# Patient Record
Sex: Female | Born: 1995 | Race: Black or African American | Hispanic: No | Marital: Single | Smoking: Never smoker
Health system: Southern US, Community
[De-identification: ages and names within clinical notes are randomized; demographics above are authoritative.]

## PROBLEM LIST (undated history)

## (undated) DIAGNOSIS — D649 Anemia, unspecified: Secondary | ICD-10-CM

## (undated) HISTORY — PX: APPENDECTOMY: SHX54

---

## 2017-03-20 ENCOUNTER — Ambulatory Visit (HOSPITAL_COMMUNITY)
Admission: EM | Admit: 2017-03-20 | Discharge: 2017-03-20 | Disposition: A | Discharge status: Home / Self Care | Payer: Managed Care, Other (non HMO) | Attending: Family Medicine | Admitting: Family Medicine

## 2017-03-20 ENCOUNTER — Encounter (HOSPITAL_COMMUNITY): Payer: Self-pay | Admitting: Emergency Medicine

## 2017-03-20 DIAGNOSIS — R1110 Vomiting, unspecified: Secondary | ICD-10-CM
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
## 2017-04-08 ENCOUNTER — Other Ambulatory Visit: Payer: Self-pay | Admitting: Nurse Practitioner

## 2017-04-08 DIAGNOSIS — R109 Unspecified abdominal pain: Secondary | ICD-10-CM

## 2017-04-08 DIAGNOSIS — R102 Pelvic and perineal pain: Secondary | ICD-10-CM

## 2017-04-29 ENCOUNTER — Other Ambulatory Visit: Payer: Managed Care, Other (non HMO)

## 2017-06-08 ENCOUNTER — Emergency Department (HOSPITAL_COMMUNITY)
Admission: EM | Admit: 2017-06-08 | Discharge: 2017-06-08 | Disposition: A | Discharge status: Home / Self Care | Payer: Managed Care, Other (non HMO) | Attending: Emergency Medicine | Admitting: Emergency Medicine

## 2017-06-08 ENCOUNTER — Encounter (HOSPITAL_COMMUNITY): Payer: Self-pay | Admitting: Emergency Medicine

## 2017-06-08 ENCOUNTER — Emergency Department (HOSPITAL_COMMUNITY): Payer: Managed Care, Other (non HMO)

## 2017-06-08 DIAGNOSIS — R0789 Other chest pain: Secondary | ICD-10-CM | POA: Diagnosis not present

## 2017-06-08 DIAGNOSIS — R195 Other fecal abnormalities: Secondary | ICD-10-CM | POA: Insufficient documentation

## 2017-06-08 DIAGNOSIS — R1030 Lower abdominal pain, unspecified: Secondary | ICD-10-CM | POA: Insufficient documentation

## 2017-06-08 LAB — CBC
HCT: 36.4 % (ref 36.0–46.0)
Hemoglobin: 11.8 g/dL — ABNORMAL LOW (ref 12.0–15.0)
MCH: 25.8 pg — AB (ref 26.0–34.0)
MCHC: 32.4 g/dL (ref 30.0–36.0)
MCV: 79.5 fL (ref 78.0–100.0)
PLATELETS: 302 10*3/uL (ref 150–400)
RBC: 4.58 MIL/uL (ref 3.87–5.11)
RDW: 15.5 % (ref 11.5–15.5)
WBC: 10 10*3/uL (ref 4.0–10.5)

## 2017-06-08 LAB — COMPREHENSIVE METABOLIC PANEL
ALT: 15 U/L (ref 14–54)
AST: 23 U/L (ref 15–41)
Albumin: 3.7 g/dL (ref 3.5–5.0)
Alkaline Phosphatase: 75 U/L (ref 38–126)
Anion gap: 9 (ref 5–15)
BUN: 9 mg/dL (ref 6–20)
CHLORIDE: 103 mmol/L (ref 101–111)
CO2: 25 mmol/L (ref 22–32)
CREATININE: 0.89 mg/dL (ref 0.44–1.00)
Calcium: 9 mg/dL (ref 8.9–10.3)
GFR calc Af Amer: >60 mL/min (ref >60)
Glucose, Bld: 94 mg/dL (ref 65–99)
POTASSIUM: 3.8 mmol/L (ref 3.5–5.1)
SODIUM: 137 mmol/L (ref 135–145)
Total Bilirubin: 0.6 mg/dL (ref 0.3–1.2)
Total Protein: 7.3 g/dL (ref 6.5–8.1)

## 2017-06-08 LAB — I-STAT BETA HCG BLOOD, ED (MC, WL, AP ONLY): I-stat hCG, quantitative: <5 m[IU]/mL (ref <5)

## 2017-06-08 LAB — LIPASE, BLOOD: LIPASE: 33 U/L (ref 11–51)

## 2017-06-08 LAB — URINALYSIS, ROUTINE W REFLEX MICROSCOPIC
Bilirubin Urine: NEGATIVE
GLUCOSE, UA: NEGATIVE mg/dL
Hgb urine dipstick: NEGATIVE
Ketones, ur: NEGATIVE mg/dL
LEUKOCYTES UA: NEGATIVE
Nitrite: NEGATIVE
Protein, ur: NEGATIVE mg/dL
Specific Gravity, Urine: 1.005 (ref 1.005–1.030)
pH: 8 (ref 5.0–8.0)

## 2017-06-08 LAB — I-STAT TROPONIN, ED: TROPONIN I, POC: 0 ng/mL (ref 0.00–0.08)

## 2017-06-08 MED ORDER — DICYCLOMINE HCL 20 MG PO TABS: 20.0000 mg | ORAL_TABLET | Freq: Three times a day (TID) | ORAL | 0 refills | Status: DC | PRN

## 2017-06-08 NOTE — ED Provider Notes (Signed)
[facility] Provider Note   CSN: [id number] Arrival date & time: 06/08/17  0118     History   Chief Complaint Chief Complaint  Patient presents with  . Abdominal Pain  . Chest Pain    HPI Michele Marquez is a 22 y.o. female.  Planes of lower spasm-like abdominal pain onset September 2018.  Pain became worse over the past 2 weeks.  She vomited one time 3 days ago.  No nausea present.  Ate chicken wings and [place] fri[place] yesterday.  Had blood extend with bowel movement 2 weeks ago.  Last bowel movement was yesterday normal.  Denies any fever.  She reports she has intermittent sharp pains at lower abdomen which last 3 seconds at a time and also has chest pain anterior lasting 3 seconds at a time went at the same time that she has sharp pains in her abdomen.  She denies any shortness of breath.  No treatment prior to coming here.  Nothing makes symptoms better or worse.  Last normal menstrual period approximately 2 weeks ago.  HPI  History reviewed. No pertinent past medical history.  There are no active problems to display for this patient.   Past Surgical History:  Procedure Laterality Date  . APPENDECTOMY      OB History    No data available       Home Medications    Prior to Admission medications   Medication Sig Start Date End Date Taking? Authorizing Provider  dicyclomine (BENTYL) 20 MG tablet Take 1 tablet (20 mg total) by mouth 3 (three) times daily as needed for spasms. Take 1 tablet as needed 3 times daily for abdominal spasms 06/08/17   Doug SouJacubowitz, Katelee Schupp, MD    Family History No family history on file.  Social History Social History   Tobacco Use  . Smoking status: Never Smoker  . Smokeless tobacco: Never Used  Substance Use Topics  . Alcohol use: Yes  . Drug use: Yes    Types: Marijuana     Allergies   Patient has no known allergies.   Review of Systems Review of Systems  Constitutional: Negative.   HENT:  Negative.   Respiratory: Negative.   Cardiovascular: Positive for chest pain.  Gastrointestinal: Positive for abdominal pain and blood in stool.  Musculoskeletal: Negative.   Skin: Negative.   Neurological: Negative.   Psychiatric/Behavioral: Negative.   All other systems reviewed and are negative.    Physical Exam Updated Vital Signs BP 115/71   Pulse 63   Temp 97.7 F (36.5 C) (Oral)   Resp 11   LMP 05/21/2017   SpO2 100%   Physical Exam  Constitutional: She appears well-developed and well-nourished.  HENT:  Head: Normocephalic and atraumatic.  Eyes: Conjunctivae are normal. Pupils are equal, round, and reactive to light.  Neck: Neck supple. No tracheal deviation present. No thyromegaly present.  Cardiovascular: Normal rate and regular rhythm.  No murmur heard. Pulmonary/Chest: Effort normal and breath sounds normal.  Abdominal: Soft. Bowel sounds are normal. She exhibits no distension. There is no tenderness.  Musculoskeletal: Normal range of motion. She exhibits no edema or tenderness.  Neurological: She is alert. Coordination normal.  Skin: Skin is warm and dry. No rash noted.  Psychiatric: She has a normal mood and affect.  Nursing note and vitals reviewed.    ED Treatments / Results  Labs (all labs ordered are listed, but only abnormal results are displayed) Labs Reviewed  CBC - Abnormal; Notable  for the following components:      Result Value   Hemoglobin 11.8 (*)    MCH 25.8 (*)    All other components within normal limits  URINALYSIS, ROUTINE W REFLEX MICROSCOPIC - Abnormal; Notable for the following components:   Color, Urine STRAW (*)    All other components within normal limits  LIPASE, BLOOD  COMPREHENSIVE METABOLIC PANEL  I-STAT BETA HCG BLOOD, ED ([facility], [facility], [facility] ONLY)  I-STAT TROPONIN, ED   Results for orders placed or performed during the hospital encounter of 06/08/17  Lipase, blood  Result Value Ref Range   Lipase 33 11 - 51 U/L    Comprehensive metabolic panel  Result Value Ref Range   Sodium 137 135 - 145 mmol/L   Potassium 3.8 3.5 - 5.1 mmol/L   Chloride 103 101 - 111 mmol/L   CO2 25 22 - 32 mmol/L   Glucose, Bld 94 65 - 99 mg/dL   BUN 9 6 - 20 mg/dL   Creatinine, Ser 1.61 0.44 - 1.00 mg/dL   Calcium 9.0 8.9 - 09.6 mg/dL   Total Protein 7.3 6.5 - 8.1 g/dL   Albumin 3.7 3.5 - 5.0 g/dL   AST 23 15 - 41 U/L   ALT 15 14 - 54 U/L   Alkaline Phosphatase 75 38 - 126 U/L   Total Bilirubin 0.6 0.3 - 1.2 mg/dL   GFR calc non Af Amer >60 >60 mL/min   GFR calc Af Amer >60 >60 mL/min   Anion gap 9 5 - 15  CBC  Result Value Ref Range   WBC 10.0 4.0 - 10.5 K/uL   RBC 4.58 3.87 - 5.11 MIL/uL   Hemoglobin 11.8 (L) 12.0 - 15.0 g/dL   HCT 04.5 40.9 - 81.1 %   MCV 79.5 78.0 - 100.0 fL   MCH 25.8 (L) 26.0 - 34.0 pg   MCHC 32.4 30.0 - 36.0 g/dL   RDW 91.4 78.2 - 95.6 %   Platelets 302 150 - 400 K/uL  Urinalysis, Routine w reflex microscopic  Result Value Ref Range   Color, Urine STRAW (A) YELLOW   APPearance CLEAR CLEAR   Specific Gravity, Urine 1.005 1.005 - 1.030   pH 8.0 5.0 - 8.0   Glucose, UA NEGATIVE NEGATIVE mg/dL   Hgb urine dipstick NEGATIVE NEGATIVE   Bilirubin Urine NEGATIVE NEGATIVE   Ketones, ur NEGATIVE NEGATIVE mg/dL   Protein, ur NEGATIVE NEGATIVE mg/dL   Nitrite NEGATIVE NEGATIVE   Leukocytes, UA NEGATIVE NEGATIVE  I-Stat beta hCG blood, ED  Result Value Ref Range   I-stat hCG, quantitative <5.0 <5 mIU/mL   Comment 3          I-Stat Troponin, ED (not at [facility])  Result Value Ref Range   Troponin i, poc 0.00 0.00 - 0.08 ng/mL   Comment 3          Chest x-ray viewed by me Dg Chest 2 View  Result Date: 06/08/2017 CLINICAL DATA:  Left-sided chest pain for 2 weeks. EXAM: CHEST  2 VIEW COMPARISON:  None. FINDINGS: The cardiomediastinal contours are normal. The lungs are clear. Pulmonary vasculature is normal. No consolidation, pleural effusion, or pneumothorax. No acute osseous abnormalities are  seen. IMPRESSION: Normal radiographs of the chest. Electronically Signed   By: Rubye Oaks M.D.   On: 06/08/2017 03:49    EKG  EKG Interpretation  Date/Time:  Tuesday June 08 2017 01:40:19 EST Ventricular Rate:  92 PR Interval:  134 QRS Duration: 86  QT Interval:  350 QTC Calculation: 432 R Axis:   85 Text Interpretation:  Normal sinus rhythm with sinus arrhythmia Possible Left atrial enlargement Nonspecific T wave abnormality Abnormal ECG No old tracing to compare Confirmed by [place], Doreatha Martin [phone]) on 06/08/2017 9:48:42 AM       Radiology Dg Chest 2 View  Result Date: 06/08/2017 CLINICAL DATA:  Left-sided chest pain for 2 weeks. EXAM: CHEST  2 VIEW COMPARISON:  None. FINDINGS: The cardiomediastinal contours are normal. The lungs are clear. Pulmonary vasculature is normal. No consolidation, pleural effusion, or pneumothorax. No acute osseous abnormalities are seen. IMPRESSION: Normal radiographs of the chest. Electronically Signed   By: Rubye Oaks M.D.   On: 06/08/2017 03:49    Procedures Procedures (including critical care time)  Medications Ordered in ED Medications - No data to display   Initial Impression / Assessment and Plan / ED Course  I have reviewed the triage vital signs and the nursing notes.  Pertinent labs & imaging results that were available during my care of the patient were reviewed by me and considered in my medical decision making (see chart for details).     Pain felt to be nonspecific.  Strongly doubt obstruction.  Patient scheduled for colonoscopy 8 days from now.  Possibility of inflammatory bowel disease discussed with patient.  Prescription Bentyl.  Keep scheduled appointment for colonoscopy 06/16/2017  Final Clinical Impressions(s) / ED Diagnoses   Final diagnoses:  Lower abdominal pain  Atypical chest pain    ED Discharge Orders        Ordered    dicyclomine (BENTYL) 20 MG tablet  3 times daily PRN     06/08/17 1004        Doug Sou, MD 06/08/17 1009

## 2017-06-08 NOTE — ED Triage Notes (Signed)
Patient reports mid abdominal and left chest pain for 2 weeks with occasional bloody stools , mild SOB , no emesis or diaphoresis , GI consult with colonoscopy on Jan.16.

## 2017-06-08 NOTE — Discharge Instructions (Signed)
Keep your scheduled appointment with your gastroenterologist June 16, 2017

## 2018-11-29 IMAGING — CR DG CHEST 2V
2 series · 2 of 2 positions shown · non-contrast
Comparison: None.

CLINICAL DATA: Left-sided chest pain for 2 weeks.

EXAM:
CHEST  2 VIEW

[chest pa]
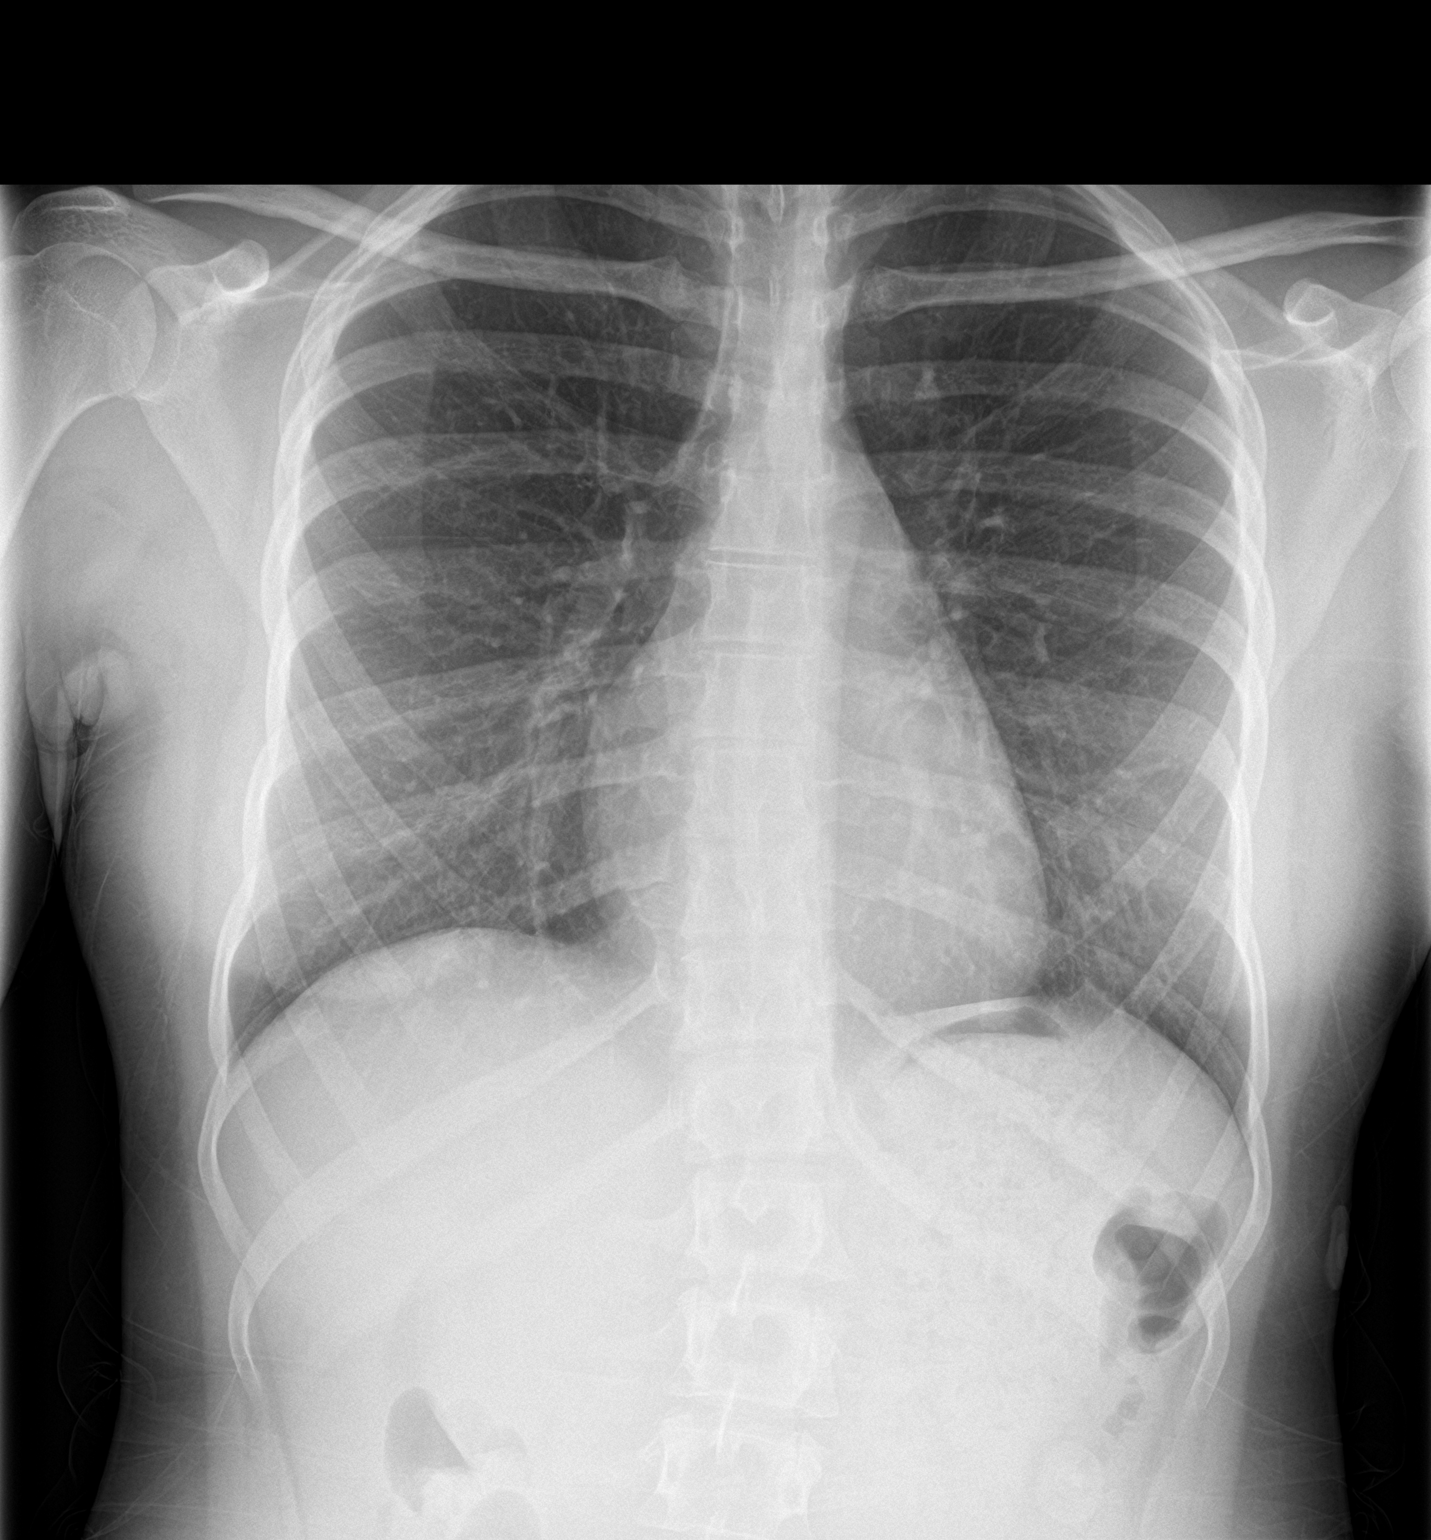

[chest lat]
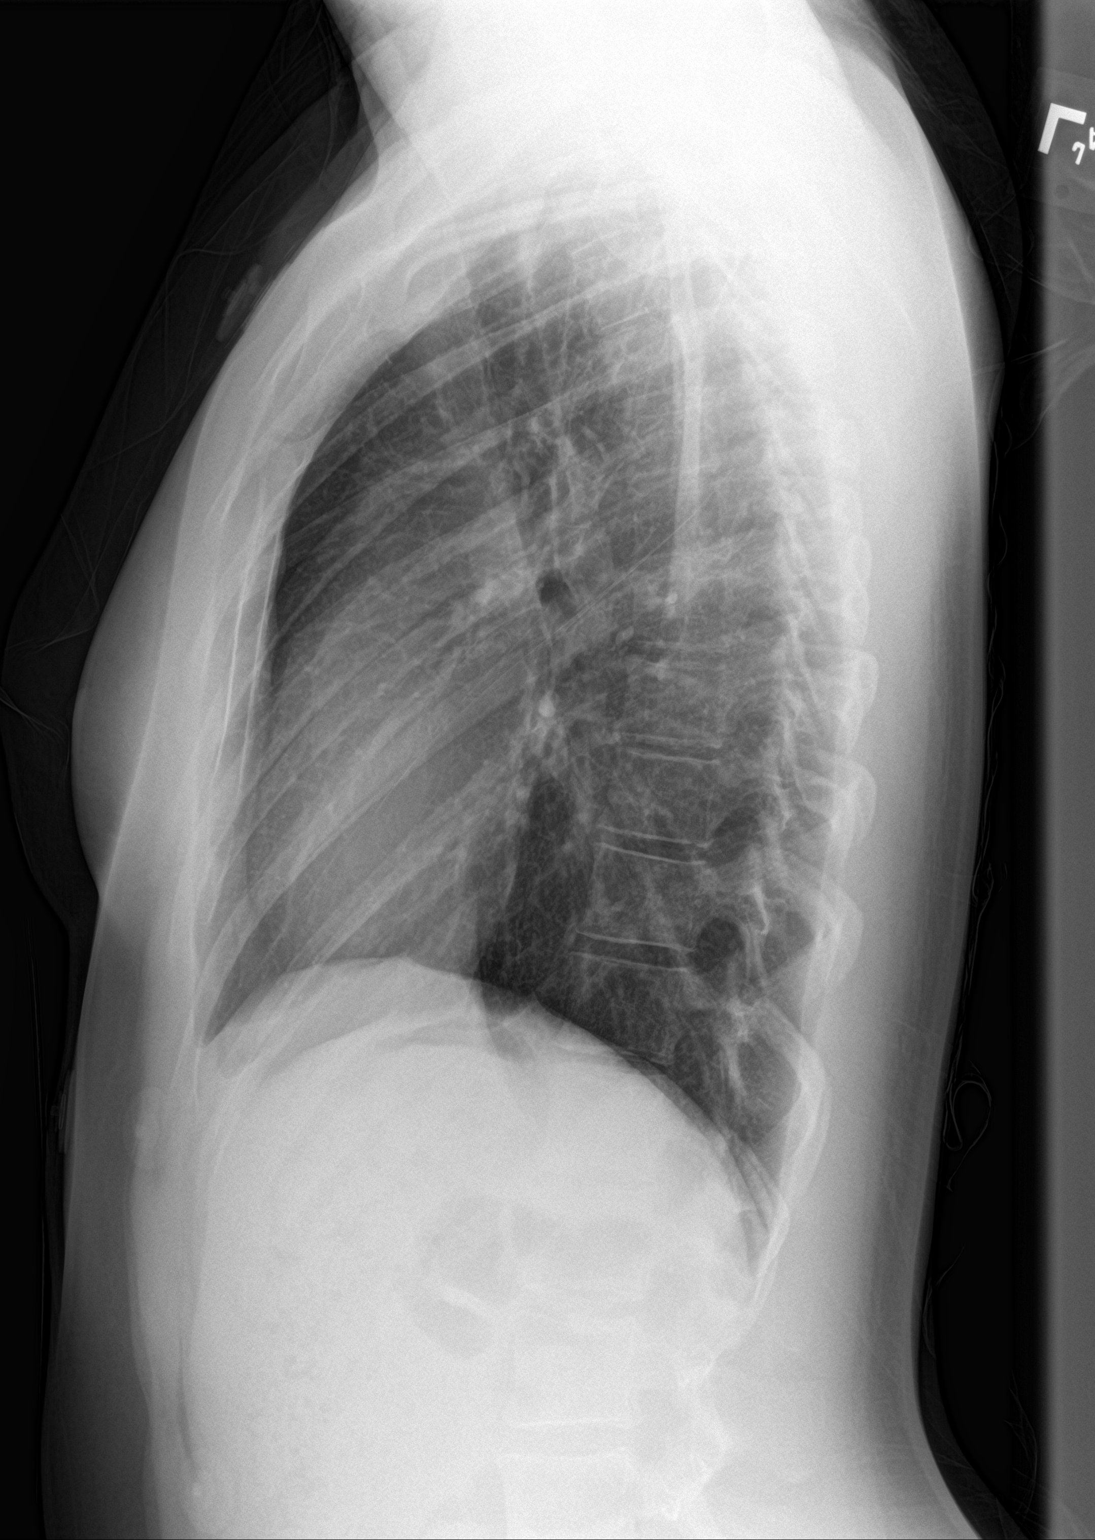

[2 of 2 positions shown; findings below may reference images not displayed]

FINDINGS: The cardiomediastinal contours are normal. The lungs are clear.
Pulmonary vasculature is normal. No consolidation, pleural effusion,
or pneumothorax. No acute osseous abnormalities are seen.
IMPRESSION: Normal radiographs of the chest.

## 2019-07-14 ENCOUNTER — Inpatient Hospital Stay (HOSPITAL_COMMUNITY)
Admission: AD | Admit: 2019-07-14 | Discharge: 2019-07-15 | Disposition: A | Discharge status: Home / Self Care | Payer: Managed Care, Other (non HMO) | Attending: Obstetrics & Gynecology | Admitting: Obstetrics & Gynecology

## 2019-07-14 DIAGNOSIS — O034 Incomplete spontaneous abortion without complication: Secondary | ICD-10-CM | POA: Insufficient documentation

## 2019-07-14 DIAGNOSIS — O4690 Antepartum hemorrhage, unspecified, unspecified trimester: Secondary | ICD-10-CM

## 2019-07-14 DIAGNOSIS — Z3A10 10 weeks gestation of pregnancy: Secondary | ICD-10-CM | POA: Insufficient documentation

## 2019-07-14 HISTORY — DX: Anemia, unspecified: D64.9
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
## 2019-07-15 ENCOUNTER — Encounter (HOSPITAL_COMMUNITY): Payer: Self-pay | Admitting: *Deleted

## 2019-07-15 ENCOUNTER — Inpatient Hospital Stay (HOSPITAL_COMMUNITY): Payer: Managed Care, Other (non HMO)

## 2019-07-15 DIAGNOSIS — O034 Incomplete spontaneous abortion without complication: Secondary | ICD-10-CM | POA: Diagnosis present

## 2019-07-15 DIAGNOSIS — Z3A10 10 weeks gestation of pregnancy: Secondary | ICD-10-CM | POA: Diagnosis not present

## 2019-07-15 DIAGNOSIS — O4690 Antepartum hemorrhage, unspecified, unspecified trimester: Secondary | ICD-10-CM

## 2019-07-15 LAB — CBC
HCT: 36.6 % (ref 36.0–46.0)
Hemoglobin: 12.5 g/dL (ref 12.0–15.0)
MCH: 28.2 pg (ref 26.0–34.0)
MCHC: 34.2 g/dL (ref 30.0–36.0)
MCV: 82.6 fL (ref 80.0–100.0)
Platelets: 257 10*3/uL (ref 150–400)
RBC: 4.43 MIL/uL (ref 3.87–5.11)
RDW: 13.8 % (ref 11.5–15.5)
WBC: 10.4 10*3/uL (ref 4.0–10.5)
nRBC: 0 % (ref 0.0–0.2)

## 2019-07-15 LAB — WET PREP, GENITAL
Clue Cells Wet Prep HPF POC: NONE SEEN
Sperm: NONE SEEN
Trich, Wet Prep: NONE SEEN
WBC, Wet Prep HPF POC: NONE SEEN
Yeast Wet Prep HPF POC: NONE SEEN

## 2019-07-15 LAB — URINALYSIS, ROUTINE W REFLEX MICROSCOPIC
Bacteria, UA: NONE SEEN
Bilirubin Urine: NEGATIVE
Glucose, UA: NEGATIVE mg/dL
Ketones, ur: NEGATIVE mg/dL
Leukocytes,Ua: NEGATIVE
Nitrite: NEGATIVE
Protein, ur: NEGATIVE mg/dL
RBC / HPF: >50 RBC/hpf — ABNORMAL HIGH (ref 0–5)
Specific Gravity, Urine: 1.017 (ref 1.005–1.030)
pH: 6 (ref 5.0–8.0)

## 2019-07-15 LAB — ABO/RH: ABO/RH(D): A POS

## 2019-07-15 LAB — HCG, QUANTITATIVE, PREGNANCY: hCG, Beta Chain, Quant, S: 52487 m[IU]/mL — ABNORMAL HIGH (ref <5)

## 2019-07-15 LAB — POCT PREGNANCY, URINE: Preg Test, Ur: POSITIVE — AB

## 2019-07-15 NOTE — Discharge Instructions (Signed)
Miscarriage A miscarriage is the loss of an unborn baby (fetus) before the 20th week of pregnancy. Most miscarriages happen during the first 3 months of pregnancy. Sometimes, a miscarriage can happen before a woman knows that she is pregnant. Having a miscarriage can be an emotional experience. If you have had a miscarriage, talk with your health care provider about any questions you may have about miscarrying, the grieving process, and your plans for future pregnancy. What are the causes? A miscarriage may be caused by:  Problems with the genes or chromosomes of the fetus. These problems make it impossible for the baby to develop normally. They are often the result of random errors that occur early in the development of the baby, and are not passed from parent to child (not inherited).  Infection of the cervix or uterus.  Conditions that affect hormone balance in the body.  Problems with the cervix, such as the cervix opening and thinning before pregnancy is at term (cervical insufficiency).  Problems with the uterus. These may include: ? A uterus with an abnormal shape. ? Fibroids in the uterus. ? Congenital abnormalities. These are problems that were present at birth.  Certain medical conditions.  Smoking, drinking alcohol, or using drugs.  Injury (trauma). In many cases, the cause of a miscarriage is not known. What are the signs or symptoms? Symptoms of this condition include:  Vaginal bleeding or spotting, with or without cramps or pain.  Pain or cramping in the abdomen or lower back.  Passing fluid, tissue, or blood clots from the vagina. How is this diagnosed? This condition may be diagnosed based on:  A physical exam.  Ultrasound.  Blood tests.  Urine tests. How is this treated? Treatment for a miscarriage is sometimes not necessary if you naturally pass all the tissue that was in your uterus. If necessary, this condition may be treated with:  Dilation and  curettage (D&C). This is a procedure in which the cervix is stretched open and the lining of the uterus (endometrium) is scraped. This is done only if tissue from the fetus or placenta remains in the body (incomplete miscarriage).  Medicines, such as: ? Antibiotic medicine, to treat infection. ? Medicine to help the body pass any remaining tissue. ? Medicine to reduce (contract) the size of the uterus. These medicines may be given if you have a lot of bleeding. If you have Rh negative blood and your baby was Rh positive, you will need a shot of a medicine called Rh immunoglobulinto protect your future babies from Rh blood problems. "Rh-negative" and "Rh-positive" refer to whether or not the blood has a specific protein found on the surface of red blood cells (Rh factor). Follow these instructions at home: Medicines   Take over-the-counter and prescription medicines only as told by your health care provider.  If you were prescribed antibiotic medicine, take it as told by your health care provider. Do not stop taking the antibiotic even if you start to feel better.  Do not take NSAIDs, such as aspirin and ibuprofen, unless they are approved by your health care provider. These medicines can cause bleeding. Activity  Rest as directed. Ask your health care provider what activities are safe for you.  Have someone help with home and family responsibilities during this time. General instructions  Keep track of the number of sanitary pads you use each day and how soaked (saturated) they are. Write down this information.  Monitor the amount of tissue or blood clots that   you pass from your vagina. Save any large amounts of tissue for your health care provider to examine.  Do not use tampons, douche, or have sex until your health care provider approves.  To help you and your partner with the process of grieving, talk with your health care provider or seek counseling.  When you are ready, meet with  your health care provider to discuss any important steps you should take for your health. Also, discuss steps you should take to have a healthy pregnancy in the future.  Keep all follow-up visits as told by your health care provider. This is important. Where to find more information  The American Congress of Obstetricians and Gynecologists: www.acog.org  [place] Department of Health and Programmer, systems of Women's Health: [place] Contact a health care provider if:  You have a fever or chills.  You have a foul smelling vaginal discharge.  You have more bleeding instead of less. Get help right away if:  You have severe cramps or pain in your back or abdomen.  You pass blood clots or tissue from your vagina that is walnut-sized or larger.  You soak more than 1 regular sanitary pad in an hour.  You become light-headed or weak.  You pass out.  You have feelings of sadness that take over your thoughts, or you have thoughts of hurting yourself. Summary  Most miscarriages happen in the first 3 months of pregnancy. Sometimes miscarriage happens before a woman even knows that she is pregnant.  Follow your health care provider's instruction for home care. Keep all follow-up appointments.  To help you and your partner with the process of grieving, talk with your health care provider or seek counseling. This information is not intended to replace advice given to you by your health care provider. Make sure you discuss any questions you have with your health care provider. Document Revised: 09/09/2018 Document Reviewed: 06/23/2016 Elsevier Patient Education  [place].  Intrauterine Device Information An intrauterine device (IUD) is a medical device that is inserted in the uterus to prevent pregnancy. It is a small, T-shaped device that has one or two nylon strings hanging down from it. The strings hang out of the lower part of the uterus (cervix) to allow for future  IUD removal. There are two types of IUDs available:  Hormone IUD. This type of IUD is made of plastic and contains the hormone progestin (synthetic progesterone). A hormone IUD may last 3-5 years.  Copper IUD. This type of IUD has copper wire wrapped around it. A copper IUD may last up to 10 years. How is an IUD inserted? An IUD is inserted through the vagina and placed into the uterus with a minor medical procedure. The exact procedure for IUD insertion may vary among health care providers and hospitals. How does an IUD work? Synthetic progesterone in a hormonal IUD prevents pregnancy by:  Thickening cervical mucus to prevent sperm from entering the uterus.  Thinning the uterine lining to prevent a fertilized egg from being implanted there. Copper in a copper IUD prevents pregnancy by making the uterus and fallopian tubes produce a fluid that kills sperm. What are the advantages of an IUD? Advantages of either type of IUD  It is highly effective in preventing pregnancy.  It is reversible. You can become pregnant shortly after the IUD is removed.  It is low-maintenance and can stay in place for a long time.  There are no estrogen-related side effects.  It can be  used when breastfeeding.  It is not associated with weight gain.  It can be inserted right after childbirth, an abortion, or a miscarriage. Advantages of a hormone IUD  If it is inserted within 7 days of your period starting, it works right after it is inserted. If the hormone IUD is inserted at any other time in your cycle, you will need to use a backup method of birth control for 7 days after insertion.  It can make menstrual periods lighter.  It can reduce menstrual cramping.  It can be used for 3-5 years. Advantages of a copper IUD  It works right after it is inserted.  It can be used as a form of emergency birth control if it is inserted within 5 days after having unprotected sex.  It does not interfere with  your body's natural hormones.  It can be used for 10 years. What are the disadvantages of an IUD?  An IUD may cause irregular menstrual bleeding for a period of time after insertion.  You may have pain during insertion and have cramping and vaginal bleeding after insertion.  An IUD may cut the uterus (uterine perforation) when it is inserted. This is rare.  An IUD may cause pelvic inflammatory disease (PID), which is an infection in the uterus and fallopian tubes. This is rare, and it usually happens during the first 20 days after the IUD is inserted.  A copper IUD can make your menstrual flow heavier and more painful. How is an IUD removed?  You will lie on your back with your knees bent and your feet in footrests (stirrups).  A device will be inserted into your vagina to spread apart the vaginal walls (speculum). This will allow your health care provider to see the strings attached to the IUD.  Your health care provider will use a small instrument (forceps) to grasp the IUD strings and pull firmly until the IUD is removed. You may have some discomfort when the IUD is removed. Your health care provider may recommend taking over-the-counter pain relievers, such as ibuprofen, before the procedure. You may also have minor spotting for a few days after the procedure. The exact procedure for IUD removal may vary among health care providers and hospitals. Is the IUD right for me? Your health care provider will make sure you are a good candidate for an IUD and will discuss the advantages, disadvantages, and possible side effects with you. Summary  An intrauterine device (IUD) is a medical device that is inserted in the uterus to prevent pregnancy. It is a small, T-shaped device that has one or two nylon strings hanging down from it.  A hormone IUD contains the hormone progestin (synthetic progesterone). A copper IUD has copper wire wrapped around it.  Synthetic progesterone in a hormone IUD  prevents pregnancy by thickening cervical mucus and thinning the walls of the uterus. Copper in a copper IUD prevents pregnancy by making the uterus and fallopian tubes produce a fluid that kills sperm.  A hormone IUD can be left in place for 3-5 years. A copper IUD can be left in place for up to 10 years.  An IUD is inserted and removed by a health care provider. You may feel some pain during insertion and removal. Your health care provider may recommend taking over-the-counter pain medicine, such as ibuprofen, before an IUD procedure. This information is not intended to replace advice given to you by your health care provider. Make sure you discuss any questions you  have with your health care provider. Document Revised: 04/30/2017 Document Reviewed: 06/16/2016 Elsevier Patient Education  2020 [employer].

## 2019-07-15 NOTE — MAU Provider Note (Signed)
History     CSN: [id number]  Arrival date and time: 07/14/19 2335   First Provider Initiated Contact with Patient 07/15/19 0052      Chief Complaint  Patient presents with  . Abdominal Pain  . Vaginal Bleeding   Annarose Ouellet is a 24 y.o. G1P0 at [redacted]w[redacted]d by Definite LMP of 05/06/2019.  She presents today for Abdominal Pain and Vaginal Bleeding.  She states she started having cramping on Thursday night with some vaginal bleeding.  She states Friday night "things got bad" and she started having clotting. She states the bleeding now is "still pretty heavy."  However, the cramping is not as frequent, but remains intense when it occurs.  She rates the cramping a 6/10 and states she has not taken anything for the cramping.  She states the cramping is intensified with sitting, but is improved with walking.  She reports some vaginal discharge on Thursday during the day that was "lightish brown, but not like the cottage cheese like a yeast infection."  She reports last sexual intercourse was last Saturday without pain or discomfort.   Patient last ate at 9pm and last had something to drink at 11pm-water.  Patient endorses that she was seeking termination services and had an appt scheduled at [facility].      OB History    Gravida  1   Para      Term      Preterm      AB      Living        SAB      TAB      Ectopic      Multiple      Live Births              Past Medical History:  Diagnosis Date  . Anemia     Past Surgical History:  Procedure Laterality Date  . APPENDECTOMY      Family History  Problem Relation Age of Onset  . Cancer Father     Social History   Tobacco Use  . Smoking status: Never Smoker  . Smokeless tobacco: Never Used  Substance Use Topics  . Alcohol use: Not Currently  . Drug use: Yes    Types: Marijuana    Allergies: No Known Allergies  Medications Prior to Admission  Medication Sig Dispense Refill Last Dose  . dicyclomine  (BENTYL) 20 MG tablet Take 1 tablet (20 mg total) by mouth 3 (three) times daily as needed for spasms. Take 1 tablet as needed 3 times daily for abdominal spasms 20 tablet 0     Review of Systems  Constitutional: Negative for chills and fever.  Respiratory: Negative for cough and shortness of breath.   Gastrointestinal: Negative for constipation, diarrhea, nausea and vomiting.  Genitourinary: Positive for vaginal bleeding. Negative for difficulty urinating, dyspareunia, dysuria and vaginal discharge.  Musculoskeletal: Positive for back pain.  Neurological: Positive for light-headedness. Negative for dizziness and headaches.   Physical Exam   Blood pressure 123/80, pulse 86, temperature 98.7 F (37.1 C), temperature source Oral, resp. rate 20, height 5\' 7"  (1.702 m), weight 64.3 kg, last menstrual period 05/06/2019.  Physical Exam  Constitutional: She is oriented to person, place, and time. She appears well-developed and well-nourished. No distress.  HENT:  Head: Normocephalic and atraumatic.  Eyes: Conjunctivae are normal.  Cardiovascular: Normal rate, regular rhythm and normal heart sounds.  Respiratory: Effort normal and breath sounds normal.  GI: Soft. Bowel sounds are normal.  Genitourinary: Uterus is tender. Cervix exhibits no motion tenderness, no discharge and no friability.    Vaginal bleeding present.  There is bleeding in the vagina.    Genitourinary Comments: Speculum Exam: -Normal External Genitalia: Non tender, Blood noted at introitus.  -Vaginal Vault: Pink mucosa with good rugae. Small amt blood noted and removed with faux swab x 2 -wet prep collected -Cervix:Pink, no lesions, cysts, or polyps.  Appears open with clot noted and removed with ring forcep. No active bleeding from os-GC/CT collected -Bimanual Exam:  Tenderness in cul de sac and uterus that feels boggy in lower segment.     Musculoskeletal:        General: Normal range of motion.     Cervical back: Normal  range of motion.  Neurological: She is alert and oriented to person, place, and time.  Skin: Skin is warm and dry.  Psychiatric: She has a normal mood and affect. Her behavior is normal.    MAU Course  Procedures Results for orders placed or performed during the hospital encounter of 07/14/19 (from the past 24 hour(s))  Pregnancy, urine POC     Status: Abnormal   Collection Time: 07/15/19 12:12 AM  Result Value Ref Range   Preg Test, Ur POSITIVE (A) NEGATIVE  Urinalysis, Routine w reflex microscopic     Status: Abnormal   Collection Time: 07/15/19 12:47 AM  Result Value Ref Range   Color, Urine YELLOW YELLOW   APPearance CLEAR CLEAR   Specific Gravity, Urine 1.017 1.005 - 1.030   pH 6.0 5.0 - 8.0   Glucose, UA NEGATIVE NEGATIVE mg/dL   Hgb urine dipstick LARGE (A) NEGATIVE   Bilirubin Urine NEGATIVE NEGATIVE   Ketones, ur NEGATIVE NEGATIVE mg/dL   Protein, ur NEGATIVE NEGATIVE mg/dL   Nitrite NEGATIVE NEGATIVE   Leukocytes,Ua NEGATIVE NEGATIVE   RBC / HPF >50 (H) 0 - 5 RBC/hpf   WBC, UA 6-10 0 - 5 WBC/hpf   Bacteria, UA NONE SEEN NONE SEEN   Squamous Epithelial / LPF 0-5 0 - 5   Mucus PRESENT   Wet prep, genital     Status: None   Collection Time: 07/15/19 12:59 AM  Result Value Ref Range   Yeast Wet Prep HPF POC NONE SEEN NONE SEEN   Trich, Wet Prep NONE SEEN NONE SEEN   Clue Cells Wet Prep HPF POC NONE SEEN NONE SEEN   WBC, Wet Prep HPF POC NONE SEEN NONE SEEN   Sperm NONE SEEN   ABO/Rh     Status: None   Collection Time: 07/15/19  1:47 AM  Result Value Ref Range   ABO/RH(D) A POS    No rh immune globuloin      NOT A RH IMMUNE GLOBULIN CANDIDATE, PT RH POSITIVE Performed at [facility], [address] [place], [place] [address]   CBC     Status: None   Collection Time: 07/15/19  1:47 AM  Result Value Ref Range   WBC 10.4 4.0 - 10.5 K/uL   RBC 4.43 3.87 - 5.11 MIL/uL   Hemoglobin 12.5 12.0 - 15.0 g/dL   HCT 31.5 17.6 - 16.0 %   MCV 82.6 80.0 - 100.0 fL    MCH 28.2 26.0 - 34.0 pg   MCHC 34.2 30.0 - 36.0 g/dL   RDW 73.7 10.6 - 26.9 %   Platelets 257 150 - 400 K/uL   nRBC 0.0 0.0 - 0.2 %  hCG, quantitative, pregnancy     Status:  Abnormal   Collection Time: 07/15/19  1:47 AM  Result Value Ref Range   hCG, Beta Chain, Quant, S 52,487 (H) <5 mIU/mL   US OB LESS THAN 14 WEEKS WITH OB TRANSVAGINAL  Result Date: 07/15/2019 CLINICAL DATA:  Pregnant patient in first-trimester pregnancy with vaginal bleeding and cramping. Beta HCG pending. EXAM: OBSTETRIC <14 WK [place] AND TRANSVAGINAL OB US TECHNIQUE: Both transabdominal and transvaginal ultrasound examinations were performed for complete evaluation of the gestation as well as the maternal uterus, adnexal regions, and pelvic cul-de-sac. Transvaginal technique was performed to assess early pregnancy. COMPARISON:  None. FINDINGS: Intrauterine gestational sac: Possible irregularly-shaped in the lower uterine segment. Yolk sac:  Not Visualized. Embryo:  Not Visualized. Cardiac Activity: Not Visualized. Maternal uterus/adnexae: The uterus is retroverted. Thickened 10 endometrium measuring up to 19 mm with heterogeneous endometrial contents. Possible irregularly-shaped cystic structure/gestational sac the lower uterine segment. Right ovary appears normal measuring 1.7 x 1.3 x 1.3 cm with blood flow. Left ovary appears normal measuring 2.5 x 1.4 x 2.4 cm with blood flow. No adnexal mass or pelvic free fluid. IMPRESSION: 1. Heterogeneously thickened endometrium with possible small irregular gestational sac in the lower uterine segment. Findings are technically indeterminate but highly suspicious for failed pregnancy. There is no definite IUP. Recommend trending of beta HCG and follow-up ultrasound as indicated. 2. Normal sonographic appearance of both ovaries. No adnexal mass or findings suspicious for topic pregnancy. No pelvic free fluid. Electronically Signed   By: Keith Rake M.D.   On: 07/15/2019 02:52     MDM Pelvic Exam; Wet Prep and GC/CT Labs: UA, UPT, CBC, hCG, ABO Ultrasound Assessment and Plan  24 year old G1P0 at 10 weeks Vaginal Bleeding   -Reviewed POC with patient. -Exam performed and findings discussed.  -Patient questioned on what type of birth control she will utilize if she does miscarry and states she does not plan to start anything.  -Cultures collected and pending.  -Labs pending -Will send for [place] -Patient offered and declines pain medication.  Maryann Conners 07/15/2019, 12:52 AM   Reassessment (3:09 AM) A Positive Incomplete Miscarriage  -[place] and lab results return as above. -Discussed results with patient who has no questions or concerns. -Informed that findings suspicious for miscarriage, but additional follow up necessary. -Bleeding Precautions given -Patient requests pain medication and offered ibuprofen but declines stating that it doesn't work for her. -Patient offered tylenol, but declined stating she will take some at home.  -Patient then states she does not take medications and this is why she is not on birth control.  -Scheduled for repeat lab at [facility] office on Monday at 1330. -Patient instructed to return to MAU for any concerns including heavy bleeding or uncontrolled pain. -Discharged to home in stable condition.  Maryann Conners MSN, CNM Advanced Practice Provider, [facility]

## 2019-07-17 ENCOUNTER — Other Ambulatory Visit: Payer: Self-pay

## 2019-07-17 ENCOUNTER — Ambulatory Visit (INDEPENDENT_AMBULATORY_CARE_PROVIDER_SITE_OTHER): Payer: Managed Care, Other (non HMO)

## 2019-07-17 DIAGNOSIS — O3680X0 Pregnancy with inconclusive fetal viability, not applicable or unspecified: Secondary | ICD-10-CM

## 2019-07-17 LAB — BETA HCG QUANT (REF LAB): hCG Quant: 5902 m[IU]/mL

## 2019-07-17 LAB — GC/CHLAMYDIA PROBE AMP (~~LOC~~) NOT AT ARMC
Chlamydia: NEGATIVE
Comment: NEGATIVE
Comment: NORMAL
Neisseria Gonorrhea: NEGATIVE

## 2019-07-17 NOTE — Progress Notes (Addendum)
Pt here today for STAT beta lab.  Pt reports that she is here today to f/u miscarriage to make sure that her levels go down.  Pt reports that she had pain around 0400 that lasted until about 0530 and that she is still having vaginal bleeding that she changes her pad 4 times per day. Pt advised that it will take approximately two hrs for results in which I will call her.  Pt verbalized understanding.   Received results from [facility] that pt's beta results are 5902.  Notified Nolene Bernheim, NP pt's results.  Provider agrees that pt has miscarried and to come back weekly to follow levels to zero.  Notified pt provider's recommendation and if her pain increases or her bleeding gets heavier to please go to MAU.  Pt verbalized understanding but requested to call us back tomorrow to schedule non stat beta lab because she did know her schedule for next week.    Addison Naegeli, RN 07/17/19   Chart reviewed for nurse visit. Agree with plan of care.   Currie Paris, NP 07/17/2019 5:03 PM

## 2019-08-11 ENCOUNTER — Ambulatory Visit: Payer: Managed Care, Other (non HMO)
